# Patient Record
Sex: Male | Born: 2000 | Race: White | Hispanic: Yes | Marital: Single | Smoking: Former smoker
Health system: Southern US, Community
[De-identification: ages and names within clinical notes are randomized; demographics above are authoritative.]

---
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
## 2021-12-16 DIAGNOSIS — J020 Streptococcal pharyngitis: Secondary | ICD-10-CM | POA: Insufficient documentation

## 2021-12-16 DIAGNOSIS — Z87891 Personal history of nicotine dependence: Secondary | ICD-10-CM | POA: Insufficient documentation

## 2021-12-16 DIAGNOSIS — J029 Acute pharyngitis, unspecified: Secondary | ICD-10-CM | POA: Diagnosis present

## 2021-12-16 NOTE — ED Triage Notes (Signed)
Pt has pain and swelling in throat x 2-3 days. Pt has also noticed large swollen palpable area on right side of neck just below jaw that he notices more with eating

## 2021-12-17 ENCOUNTER — Other Ambulatory Visit: Payer: Self-pay

## 2021-12-17 ENCOUNTER — Encounter (HOSPITAL_BASED_OUTPATIENT_CLINIC_OR_DEPARTMENT_OTHER): Payer: Self-pay

## 2021-12-17 ENCOUNTER — Emergency Department (HOSPITAL_BASED_OUTPATIENT_CLINIC_OR_DEPARTMENT_OTHER)
Admission: EM | Admit: 2021-12-17 | Discharge: 2021-12-17 | Disposition: A | Discharge status: Home / Self Care | Payer: Medicaid Other | Attending: Emergency Medicine | Admitting: Emergency Medicine

## 2021-12-17 DIAGNOSIS — J020 Streptococcal pharyngitis: Secondary | ICD-10-CM

## 2021-12-17 LAB — GROUP A STREP BY PCR: Group A Strep by PCR: DETECTED — AB

## 2021-12-17 MED ORDER — PENICILLIN G BENZATHINE 1200000 UNIT/2ML IM SUSY: 1.2000 10*6.[IU] | PREFILLED_SYRINGE | Freq: Once | INTRAMUSCULAR | Status: DC

## 2021-12-17 MED ORDER — DEXAMETHASONE 10 MG/ML FOR PEDIATRIC ORAL USE: 10.0000 mg | Freq: Once | INTRAMUSCULAR | Status: DC

## 2021-12-17 NOTE — ED Provider Notes (Addendum)
[facility] EMERGENCY Provider Note: Lowella Dell, MD, FACEP  CSN: [id number] MRN: [id number] ARRIVAL: 12/16/21 at 2349 ROOM: DB009/DB009   CHIEF COMPLAINT  Sore Throat   HISTORY OF PRESENT ILLNESS  12/17/21 1:51 AM Jonathan Patton is a 21 y.o. male with about 3 days of pain and swelling in his throat.  He denies fever or malaise.  He does have a large mass on the right anterior neck which becomes more prominent when eating.  He rates the pain in his throat is a 3 out of 10, worse with swallowing.   History reviewed. No pertinent past medical history.  History reviewed. No pertinent surgical history.  History reviewed. No pertinent family history.  Social History   Tobacco Use   Smoking status: Former   Smokeless tobacco: Never  Building services engineer Use: Every day  Substance Use Topics   Alcohol use: Not Currently   Drug use: Yes    Types: Marijuana    Prior to Admission medications   Not on File    Allergies Patient has no known allergies.   REVIEW OF SYSTEMS  Negative except as noted here or in the History of Present Illness.   PHYSICAL EXAMINATION  Initial Vital Signs Blood pressure (!) 144/97, pulse 98, temperature 98.4 F (36.9 C), temperature source Oral, resp. rate 18, height 5\' 11"  (1.803 m), weight 63.5 kg, SpO2 99 %.  Examination General: Well-developed, well-nourished male in no acute distress; appearance consistent with age of record HENT: normocephalic; atraumatic; tonsillar enlargement and erythema without exudate; uvula midline Eyes: Normal appearance Neck: supple; right anterior cervical lymphadenopathy Heart: regular rate and rhythm Lungs: clear to auscultation bilaterally Abdomen: soft; nondistended; nontender; bowel sounds present Extremities: No deformity; full range of motion Neurologic: Awake, alert and oriented; motor function intact in all extremities and symmetric; no facial droop Skin: Warm and dry Psychiatric: Normal mood and  affect   RESULTS  Summary of this visit's results, reviewed and interpreted by myself:   EKG Interpretation  Date/Time:    Ventricular Rate:    PR Interval:    QRS Duration:   QT Interval:    QTC Calculation:   R Axis:     Text Interpretation:         Laboratory Studies: Results for orders placed or performed during the hospital encounter of 12/17/21 (from the past 24 hour(s))  Group A Strep by PCR     Status: Abnormal   Collection Time: 12/17/21 12:05 AM   Specimen: Throat; Sterile Swab  Result Value Ref Range   Group A Strep by PCR DETECTED (A) NOT DETECTED   Imaging Studies: No results found.  ED COURSE and MDM  Nursing notes, initial and subsequent vitals signs, including pulse oximetry, reviewed and interpreted by myself.  Vitals:   12/17/21 0000  BP: (!) 144/97  Pulse: 98  Resp: 18  Temp: 98.4 F (36.9 C)  TempSrc: Oral  SpO2: 99%  Weight: 63.5 kg  Height: 5\' 11"  (1.803 m)   Medications  penicillin g benzathine (BICILLIN LA) 1200000 UNIT/2ML injection 1.2 Million Units (has no administration in time range)  dexamethasone (DECADRON) 10 MG/ML injection for Pediatric ORAL use 10 mg (has no administration in time range)    Patient is positive for strep and this is consistent with his clinical presentation.  He does not appear to have any impending airway compromise and is speaking and breathing normally.  2:09 AM Patient eloped while awaiting Bicillin injection.  He told staff  he was afraid of shots.  He was advised that there were oral antibiotic options but he chose to leave anyway.  He left before I could discuss this with him.  PROCEDURES  Procedures   ED DIAGNOSES     ICD-10-CM   1. Strep pharyngitis  J02.0          Cortne Amara, MD 12/17/21 [id number]    Paula Libra, MD 12/17/21 0210

## 2021-12-17 NOTE — ED Notes (Signed)
Patient refused to stay for treatment -- Dr. Read Drivers aware - report that pt "does not like shots" and did not want IM Bicillin for strep treatment.  This nurse did not have a chance to assess patient, medicate patient or provide d/c instructions prior to pt eloping.

## 2022-10-26 IMAGING — DX DG SHOULDER 2+V*R*
3 series · 3 of 3 positions shown · non-contrast
Comparison: None.

CLINICAL DATA: Right shoulder pain

EXAM:
RIGHT SHOULDER - 2+ VIEW

[shoulder grashey]
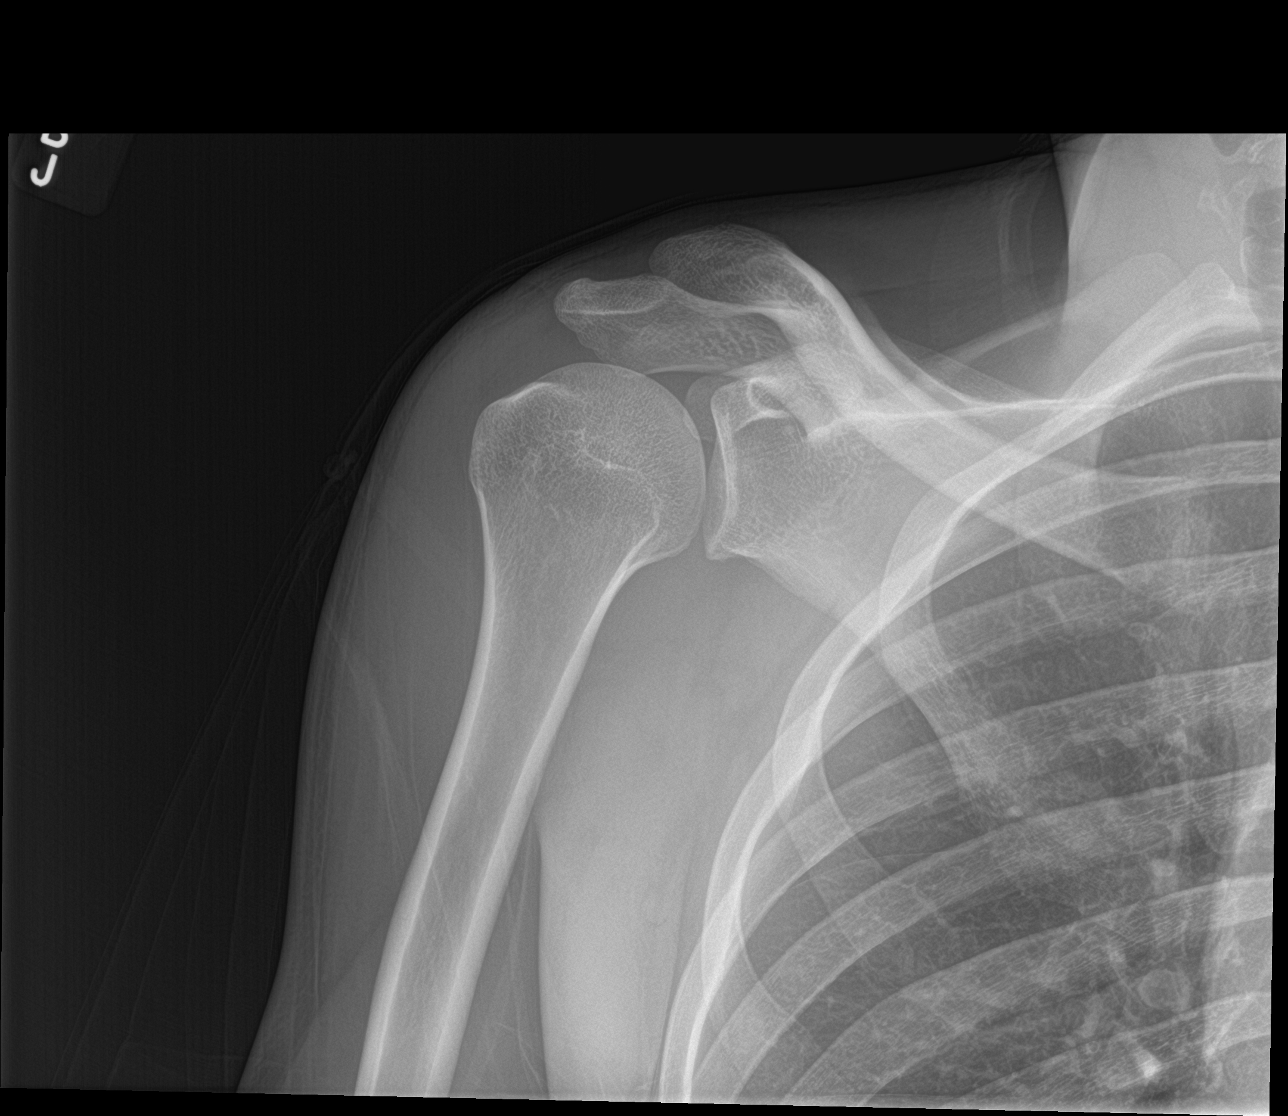

[shoulder y view (1 of 2)]
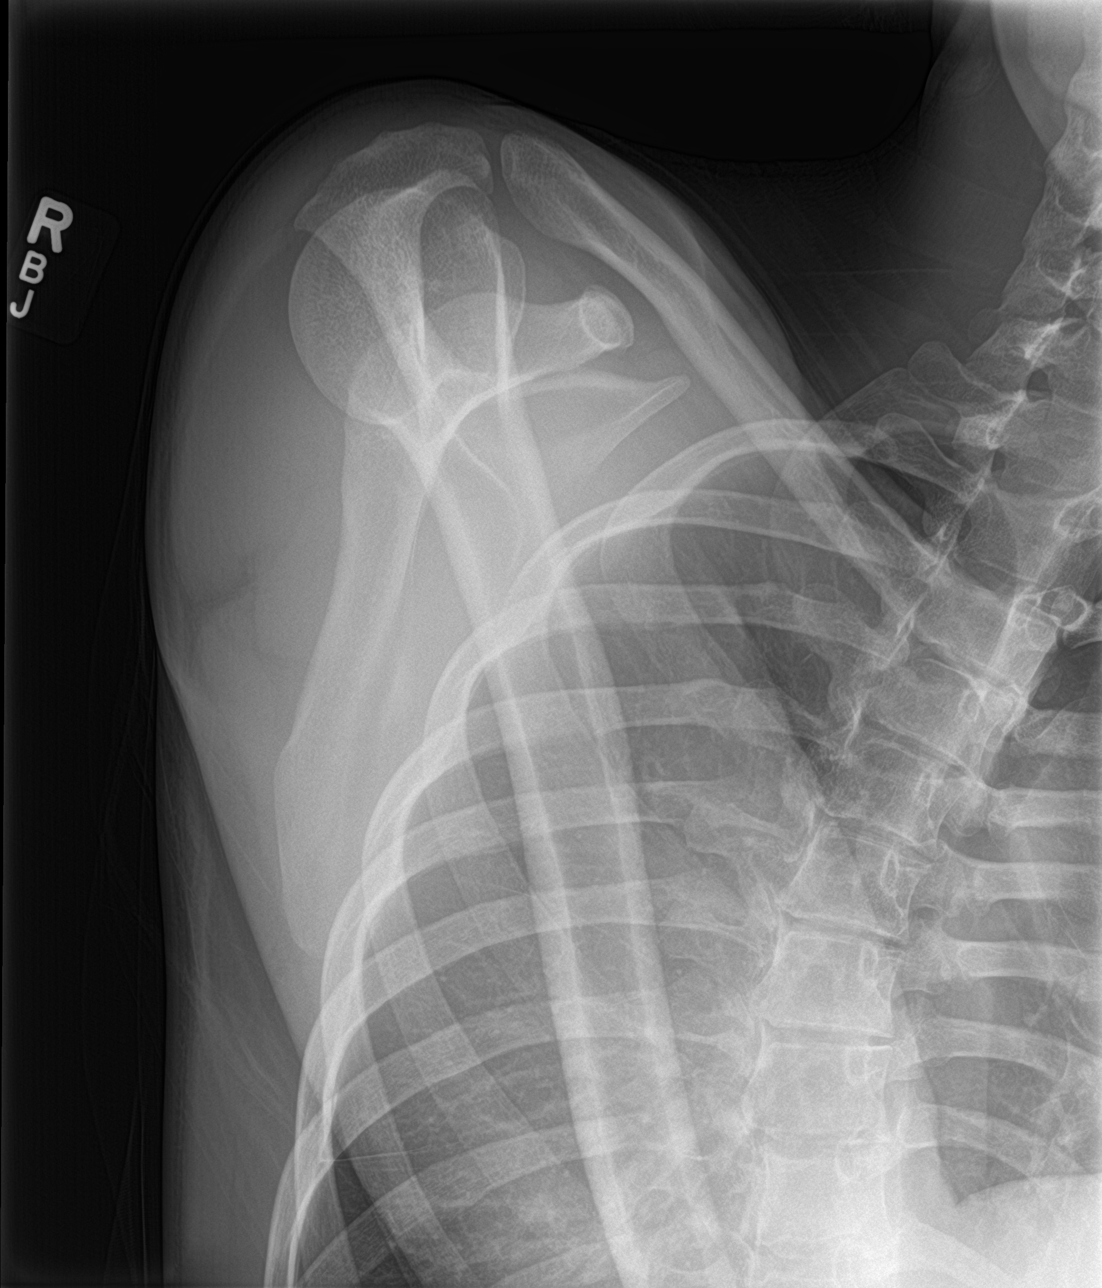

[shoulder y view (2 of 2)]
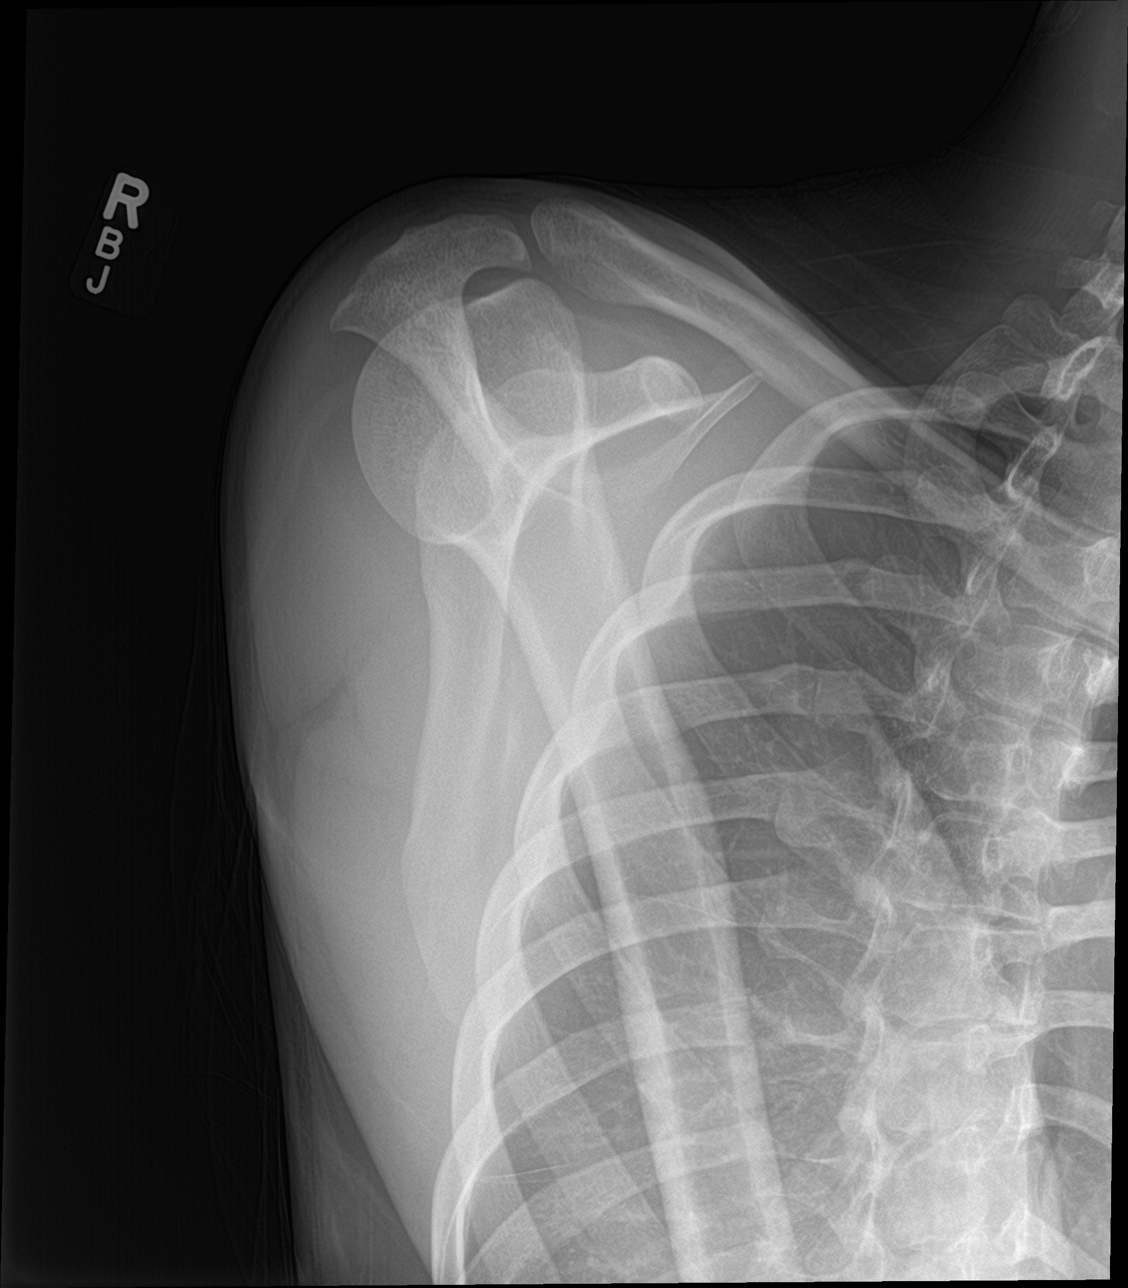

[3 of 3 positions shown; findings below may reference images not displayed]

FINDINGS: There is no evidence of fracture or dislocation. There is no
evidence of arthropathy or other focal bone abnormality. Soft
tissues are unremarkable.
IMPRESSION: Negative.

## 2024-01-01 ENCOUNTER — Encounter (HOSPITAL_COMMUNITY): Payer: Self-pay

## 2024-01-01 ENCOUNTER — Other Ambulatory Visit: Payer: Self-pay

## 2024-01-01 ENCOUNTER — Emergency Department (HOSPITAL_COMMUNITY)

## 2024-01-01 ENCOUNTER — Emergency Department (HOSPITAL_COMMUNITY)
Admission: EM | Admit: 2024-01-01 | Discharge: 2024-01-01 | Disposition: A | Discharge status: Home / Self Care | Attending: Emergency Medicine | Admitting: Emergency Medicine

## 2024-01-01 DIAGNOSIS — N132 Hydronephrosis with renal and ureteral calculous obstruction: Secondary | ICD-10-CM | POA: Insufficient documentation

## 2024-01-01 DIAGNOSIS — N200 Calculus of kidney: Secondary | ICD-10-CM | POA: Diagnosis not present

## 2024-01-01 LAB — CBC WITH DIFFERENTIAL/PLATELET
Abs Immature Granulocytes: 0.08 K/uL — ABNORMAL HIGH (ref 0.00–0.07)
Basophils Absolute: 0.1 K/uL (ref 0.0–0.1)
Basophils Relative: 0 %
Eosinophils Absolute: 0.1 K/uL (ref 0.0–0.5)
Eosinophils Relative: 1 %
HCT: 43.3 % (ref 39.0–52.0)
Hemoglobin: 15.4 g/dL (ref 13.0–17.0)
Immature Granulocytes: 0 %
Lymphocytes Relative: 11 %
Lymphs Abs: 2 K/uL (ref 0.7–4.0)
MCH: 33.3 pg (ref 26.0–34.0)
MCHC: 35.6 g/dL (ref 30.0–36.0)
MCV: 93.5 fL (ref 80.0–100.0)
Monocytes Absolute: 1.1 K/uL — ABNORMAL HIGH (ref 0.1–1.0)
Monocytes Relative: 6 %
Neutro Abs: 15 K/uL — ABNORMAL HIGH (ref 1.7–7.7)
Neutrophils Relative %: 82 %
Platelets: 237 K/uL (ref 150–400)
RBC: 4.63 MIL/uL (ref 4.22–5.81)
RDW: 14 % (ref 11.5–15.5)
WBC: 18.4 K/uL — ABNORMAL HIGH (ref 4.0–10.5)
nRBC: 0 % (ref 0.0–0.2)

## 2024-01-01 LAB — BASIC METABOLIC PANEL WITH GFR
Anion gap: 8 (ref 5–15)
BUN: 11 mg/dL (ref 6–20)
CO2: 31 mmol/L (ref 22–32)
Calcium: 9.4 mg/dL (ref 8.9–10.3)
Chloride: 102 mmol/L (ref 98–111)
Creatinine, Ser: 1.01 mg/dL (ref 0.61–1.24)
GFR, Estimated: >60 mL/min (ref >60)
Glucose, Bld: 97 mg/dL (ref 70–99)
Potassium: 4.3 mmol/L (ref 3.5–5.1)
Sodium: 140 mmol/L (ref 135–145)

## 2024-01-01 MED ORDER — TAMSULOSIN HCL 0.4 MG PO CAPS: 0.4000 mg | ORAL_CAPSULE | Freq: Every day | ORAL | 0 refills | Status: AC

## 2024-01-01 MED ORDER — OXYCODONE-ACETAMINOPHEN 5-325 MG PO TABS: 1.0000 | ORAL_TABLET | Freq: Four times a day (QID) | ORAL | 0 refills | Status: AC | PRN

## 2024-01-01 MED ORDER — KETOROLAC TROMETHAMINE 30 MG/ML IJ SOLN
30.0000 mg | Freq: Once | INTRAMUSCULAR | Status: AC
  Administered 2024-01-01: 30 mg via INTRAVENOUS
  Filled 2024-01-01: qty 1

## 2024-01-01 MED ORDER — ONDANSETRON HCL 4 MG/2ML IJ SOLN
4.0000 mg | Freq: Once | INTRAMUSCULAR | Status: AC
  Administered 2024-01-01: 4 mg via INTRAVENOUS
  Filled 2024-01-01: qty 2

## 2024-01-01 MED ORDER — MORPHINE SULFATE (PF) 4 MG/ML IV SOLN
4.0000 mg | Freq: Once | INTRAVENOUS | Status: AC
  Administered 2024-01-01: 4 mg via INTRAVENOUS
  Filled 2024-01-01: qty 1

## 2024-01-01 NOTE — ED Provider Notes (Signed)
 [facility] EMERGENCY DEPARTMENT AT [facility] Provider Note   CSN: [id number] Arrival date & time: 01/01/24  9874     Patient presents with: Flank Pain   Jonathan Patton is a 23 y.o. male.   Patient is a 23 year old male presenting with complaints of severe left-sided abdominal and flank pain.  Symptoms began suddenly while pumping gas.  Pain is severe and associated with nausea and vomiting.  He denies any bowel or bladder complaints.  He denies history of prior kidney stones.       Prior to Admission medications  Not on File    Allergies: Patient has no known allergies.    Review of Systems  All other systems reviewed and are negative.   Updated Vital Signs BP (!) 146/91 (BP Location: Right Arm)   Pulse 76   Temp 98.2 F (36.8 C) (Oral)   Resp 20   Ht 5' 11 (1.803 m)   Wt 63.5 kg   SpO2 98%   BMI 19.53 kg/m   Physical Exam Vitals and nursing note reviewed.  Constitutional:      General: He is not in acute distress.    Appearance: He is well-developed. He is not diaphoretic.     Comments: Patient is awake and alert.  He is somewhat pale and uncomfortable appearing.  HENT:     Head: Normocephalic and atraumatic.  Cardiovascular:     Rate and Rhythm: Normal rate and regular rhythm.     Heart sounds: No murmur heard.    No friction rub.  Pulmonary:     Effort: Pulmonary effort is normal. No respiratory distress.     Breath sounds: Normal breath sounds. No wheezing or rales.  Abdominal:     General: Bowel sounds are normal. There is no distension.     Palpations: Abdomen is soft.     Tenderness: There is abdominal tenderness. There is left CVA tenderness. There is no guarding or rebound.     Comments: There is tenderness in the left lower quadrant.  Musculoskeletal:        General: Normal range of motion.     Cervical back: Normal range of motion and neck supple.  Skin:    General: Skin is warm and dry.  Neurological:     Mental Status: He is  alert and oriented to person, place, and time.     Coordination: Coordination normal.     (all labs ordered are listed, but only abnormal results are displayed) Labs Reviewed - No data to display  EKG: None  Radiology: No results found.   Procedures   Medications Ordered in the ED  morphine (PF) 4 MG/ML injection 4 mg (has no administration in time range)  ketorolac (TORADOL) 30 MG/ML injection 30 mg (has no administration in time range)  ondansetron (ZOFRAN) injection 4 mg (has no administration in time range)                                    Medical Decision Making Amount and/or Complexity of Data Reviewed Labs: ordered. Radiology: ordered.  Risk Prescription drug management.   Patient is a 24 year old male presenting with severe left flank pain.  He arrives uncomfortable, but with stable vital signs.  He does have tenderness to the left abdomen, but exam otherwise unremarkable.  Laboratory studies obtained including CBC and basic metabolic panel, both unremarkable.  CT with renal protocol shows a  5 mm proximal ureteral stone.  He has been given Toradol and morphine for pain along with Zofran for nausea and is feeling significantly improved.  At this point, I feel the patient can safely be discharged with pain medication, Flomax, and outpatient follow-up with urology if symptoms persist.     Final diagnoses:  None    ED Discharge Orders     None          Geroldine Berg, MD 01/01/24 [phone]

## 2024-01-01 NOTE — Discharge Instructions (Addendum)
 Begin taking Flomax.  Begin taking Percocet as prescribed as needed for pain.  Follow-up with urology if symptoms are not improving in the next few days.  The contact information for Dr. Sherrilee has been provided in this discharge summary for you to call and make these arrangements.

## 2024-01-01 NOTE — ED Triage Notes (Signed)
 Pov from home. Cc of sudden left flank pain that started 1 hour ago. Says he's never felt pain like this before  7/10 but getting worse.  Emesis a few times after the pain started.  Denies ever having a kidney stone. Denies urinary s/s
